# Patient Record
Sex: Male | Born: 2000 | Race: White | Hispanic: No | Marital: Single | Smoking: Former smoker
Health system: Southern US, Community
[De-identification: ages and names within clinical notes are randomized; demographics above are authoritative.]

---

## 2021-04-14 ENCOUNTER — Ambulatory Visit (INDEPENDENT_AMBULATORY_CARE_PROVIDER_SITE_OTHER): Payer: BC Managed Care – PPO | Admitting: Urology

## 2021-04-14 ENCOUNTER — Other Ambulatory Visit: Payer: Self-pay

## 2021-04-14 ENCOUNTER — Encounter: Payer: Self-pay | Admitting: Urology

## 2021-04-14 VITALS — BP 105/52 | HR 65 | Ht 74.0 in | Wt 175.0 lb

## 2021-04-14 DIAGNOSIS — R5383 Other fatigue: Secondary | ICD-10-CM | POA: Diagnosis not present

## 2021-04-14 DIAGNOSIS — E291 Testicular hypofunction: Secondary | ICD-10-CM

## 2021-04-14 NOTE — Progress Notes (Signed)
° °  04/14/21 4:35 PM   Larry Cooke 2000/11/27 [id number]  CC: Low testosterone  HPI: Healthy 21 year old male who presents with low testosterone.  He is a very Radiographer, therapeutic at [employer], and his symptoms initially started about 6 months ago after taking a 1 week course of prednisone for severe poison oak.  At that time he noticed significant decrease in his libido, energy, fatigue, trouble building muscle mass despite extensive working out and eating healthy, as well as trouble with erections for a few months.  His erections have returned to normal over the last few months, but he reports persistent symptoms of low testosterone.  Testosterone was reportedly checked at outside lab and was low at that time at 91.  This increased to 300 a few months later, but on repeat a few weeks ago was low again at 279.  He is sexually active with his girlfriend and denies any problems with erections at this time.  He does not have any children currently.  He also had a borderline elevated hemoglobin A1c on lab work.   Social History:  reports that he quit smoking about 2 months ago. His smoking use included cigarettes. He has never used smokeless tobacco. He reports that he does not currently use alcohol. He reports that he does not use drugs.  Physical Exam: BP (!) 105/52 (BP Location: Left Arm, Patient Position: Sitting, Cuff Size: Normal)    Pulse 65    Ht 6\' 2"  (1.88 m)    Wt 175 lb (79.4 kg)    BMI 22.47 kg/m    Constitutional:  Alert and oriented, No acute distress. Cardiovascular: No clubbing, cyanosis, or edema. Respiratory: Normal respiratory effort, no increased work of breathing. GI: Abdomen is soft, nontender, nondistended, no abdominal masses GU: Phallus with patent meatus, no lesions, testicles 20 cc and descended bilaterally without masses   Laboratory Data: Reviewed, see HPI  Assessment & Plan:   22 year old male with multiple low testosterone levels as well as symptoms of decreased libido,  low energy, fatigue.  He thinks this all started after a 1 week course of prednisone.  He denies any problems with erections at this time.  Most recent testosterone was low at 279.  We reviewed the AUA guidelines regarding evaluation of low testosterone, and I recommended lab visit for morning testosterone, LH, FSH, prolactin, and H/H.  We discussed possible options for low testosterone to maintain fertility including Clomid, versus other less common causes like prolactinoma and possible need for brain MRI or referral to endocrinology.  Lab visit for testosterone, LH, FSH, prolactin, H&H-> call with results  I spent 50 total minutes on the day of the encounter including pre-visit review of the medical record, face-to-face time with the patient, and post visit ordering of labs/imaging/tests.  26, MD 04/14/2021  [facility] [address] [place], [place] [phone]

## 2021-04-15 ENCOUNTER — Telehealth: Payer: Self-pay | Admitting: Urology

## 2021-04-15 DIAGNOSIS — R5383 Other fatigue: Secondary | ICD-10-CM

## 2021-04-15 DIAGNOSIS — E291 Testicular hypofunction: Secondary | ICD-10-CM

## 2021-04-15 NOTE — Telephone Encounter (Signed)
Patient called the office today requesting that we add the following tests to his future lab orders: ?SHBG ?Estradial ? ?He was seen in the [facility] office on 2/28.  He states that they have a family friend that is a urologist in another state that is recommending those 2 tests to be added to the future orders that are in the system. ? ?Please advise.   ?

## 2021-04-15 NOTE — Telephone Encounter (Signed)
Okay to add to blood work panel, thanks ? ?Nickolas Madrid, MD ?04/15/2021  ?

## 2021-04-15 NOTE — Telephone Encounter (Signed)
Lab orders added.

## 2021-04-16 ENCOUNTER — Other Ambulatory Visit
Admission: RE | Admit: 2021-04-16 | Discharge: 2021-04-16 | Disposition: A | Discharge status: Home / Self Care | Payer: BC Managed Care – PPO | Attending: Urology | Admitting: Urology

## 2021-04-16 DIAGNOSIS — R5383 Other fatigue: Secondary | ICD-10-CM | POA: Insufficient documentation

## 2021-04-16 DIAGNOSIS — E291 Testicular hypofunction: Secondary | ICD-10-CM | POA: Diagnosis present

## 2021-04-16 LAB — HEMOGLOBIN AND HEMATOCRIT, BLOOD
HCT: 37.2 % — ABNORMAL LOW (ref 39.0–52.0)
Hemoglobin: 12.5 g/dL — ABNORMAL LOW (ref 13.0–17.0)

## 2021-04-17 LAB — FSH/LH
FSH: 5.6 m[IU]/mL (ref 1.5–12.4)
LH: 1.4 m[IU]/mL — ABNORMAL LOW (ref 1.7–8.6)

## 2021-04-17 LAB — PROLACTIN: Prolactin: 6 ng/mL (ref 4.0–15.2)

## 2021-04-17 LAB — ESTRADIOL: Estradiol: 7.7 pg/mL (ref 7.6–42.6)

## 2021-04-20 ENCOUNTER — Ambulatory Visit: Payer: Self-pay | Admitting: Urology

## 2021-04-21 ENCOUNTER — Ambulatory Visit (INDEPENDENT_AMBULATORY_CARE_PROVIDER_SITE_OTHER): Payer: BC Managed Care – PPO | Admitting: Urology

## 2021-04-21 ENCOUNTER — Other Ambulatory Visit: Payer: Self-pay

## 2021-04-21 DIAGNOSIS — R7989 Other specified abnormal findings of blood chemistry: Secondary | ICD-10-CM | POA: Diagnosis not present

## 2021-04-21 MED ORDER — CLOMIPHENE CITRATE 50 MG PO TABS
25.0000 mg | ORAL_TABLET | Freq: Every day | ORAL | 6 refills | Status: AC
Start: 2021-04-21 — End: ?

## 2021-04-21 NOTE — Progress Notes (Signed)
Virtual Visit via Telephone Note ? ?I connected with [name] on 04/21/21 at  1:45 PM EST by telephone and verified that I am speaking with the correct person using two identifiers. ?  ?Patient location: Home ?Provider location: [facility] ? ? ?I discussed the limitations, risks, security and privacy concerns of performing an evaluation and management service by telephone and the availability of in person appointments. We discussed the impact of the COVID-19 pandemic on the healthcare system, and the importance of social distancing and reducing patient and provider exposure. I also discussed with the patient that there may be a patient responsible charge related to this service. The patient expressed understanding and agreed to proceed. ? ?Reason for visit: ?Low testosterone ? ?History of Present Illness: ?21 year old male who reportedly had significantly decreased energy and problems with erections after a 1 week course of prednisone about 6 months ago for severe poison oak.  His erections have returned to normal since that time but he reports persistent decreased libido, low energy, fatigue, and trouble building muscle mass despite working out and increase caloric intake.  Testosterone values have been low at 91, 300, 279.  He reportedly has had some gynecomastia since puberty. ? ?At our last visit we ordered more extensive blood work including LH mildly low at 1.4, FSH normal at 5.6, prolactin normal at 6.0, H/H mildly low at 12.5/37.2, normal estradiol of 7.7.  Testosterone free, total, SHBG was also sent and is pending. ? ?We reviewed these results, and again discussed the impact of exogenous testosterone on the body's production of testosterone as well as infertility.  I recommended a trial of Clomid 25 mg daily with follow-up in 4 to 6 weeks for repeat testosterone levels.  He is very hesitant to start Clomid secondary to possible side effects he read about on the Internet.  With his  reported history of gynecomastia since puberty, as well as his hesitation to consider Clomid, I recommended referral to endocrinology for further evaluation, and even consideration of head imaging.  He also has a family friend that is a urologist that recommended another medication different from Clomid, but he is unsure what this medication was.  ? ? ?Follow Up: ?-Referral placed to endocrinology ?-Clomid sent to pharmacy for trial if patient desires, if starts Clomid would recommend follow-up testosterone in 6 weeks, and 5-month follow-up with CMP, H&H, testosterone ?  ?I discussed the assessment and treatment plan with the patient. The patient was provided an opportunity to ask questions and all were answered. The patient agreed with the plan and demonstrated an understanding of the instructions. ?  ?The patient was advised to call back or seek an in-person evaluation if the symptoms worsen or if the condition fails to improve as anticipated. ? ?I provided 13 minutes of non-face-to-face time during this encounter. ? ? ?Sondra Come, MD  ? ?

## 2021-04-23 LAB — TESTOSTERONE, FREE, TOTAL, SHBG
Sex Hormone Binding: 35.2 nmol/L (ref 16.5–55.9)
Testosterone, Free: 2 pg/mL — ABNORMAL LOW (ref 9.3–26.5)
Testosterone: 44 ng/dL — ABNORMAL LOW (ref 264–916)

## 2021-04-27 ENCOUNTER — Telehealth: Payer: Self-pay | Admitting: Urology

## 2021-04-27 ENCOUNTER — Other Ambulatory Visit: Payer: Self-pay | Admitting: Urology

## 2021-04-27 DIAGNOSIS — R7989 Other specified abnormal findings of blood chemistry: Secondary | ICD-10-CM

## 2021-04-27 NOTE — Telephone Encounter (Signed)
Called office of Dr. Ronnald Ramp back. LM as she was in a room with a patient.  ?

## 2021-04-27 NOTE — Telephone Encounter (Signed)
Incoming call from Dr. Yetta Barre, lengthy conversation had informing provider of Dr. Keane Scrape plan and the patient's refusal of clomid. Dr. Yetta Barre states that she is calling on behalf of the patient's mother who is very concerned. All questions answered. Will fax office notes per requests to [phone].  ?

## 2021-04-27 NOTE — Telephone Encounter (Signed)
Dr Gaynelle Adu left a message on the voicemail asking if Dr Aleene Davidson or his nurse would please give her a call back in reference to [employer].  Her contact number is [phone]. ?

## 2021-04-28 ENCOUNTER — Other Ambulatory Visit: Payer: Self-pay | Admitting: Family Medicine

## 2021-04-29 ENCOUNTER — Other Ambulatory Visit: Payer: Self-pay | Admitting: Family Medicine

## 2021-04-29 DIAGNOSIS — R454 Irritability and anger: Secondary | ICD-10-CM

## 2021-04-29 DIAGNOSIS — R7989 Other specified abnormal findings of blood chemistry: Secondary | ICD-10-CM

## 2021-04-29 DIAGNOSIS — E349 Endocrine disorder, unspecified: Secondary | ICD-10-CM

## 2021-04-30 NOTE — Telephone Encounter (Signed)
Patient's mom called the office today inquiring about the status of the referral to endocrinology.  I informed her that the referral was sent to [facility] on 04/21/21 and they will contact the patient to schedule the appointment. ? ?I contacted [facility] [phone]) and confirmed that the referral has been received and is being reviewed by the providers.  They confirmed that they will contact the patient to schedule the new patient appointment.   ? ? ?

## 2021-05-04 ENCOUNTER — Other Ambulatory Visit: Payer: Self-pay

## 2021-05-04 ENCOUNTER — Ambulatory Visit
Admission: RE | Admit: 2021-05-04 | Discharge: 2021-05-04 | Disposition: A | Discharge status: Home / Self Care | Payer: BC Managed Care – PPO | Source: Ambulatory Visit | Attending: Family Medicine | Admitting: Family Medicine

## 2021-05-04 DIAGNOSIS — R7989 Other specified abnormal findings of blood chemistry: Secondary | ICD-10-CM

## 2021-05-04 DIAGNOSIS — R454 Irritability and anger: Secondary | ICD-10-CM

## 2021-05-04 DIAGNOSIS — E349 Endocrine disorder, unspecified: Secondary | ICD-10-CM

## 2021-05-04 IMAGING — MR MR HEAD W/O CM
12 series · 48 of 48 positions shown · non-contrast
Comparison: No pertinent prior exams available for comparison.

CLINICAL DATA: Provided history: Hypotestosteronism. Irritability
and anger. Other specified abnormal findings of blood chemistry.

EXAM:
MRI HEAD WITHOUT CONTRAST
TECHNIQUE: Multiplanar, multiecho pulse sequences of the brain and surrounding
structures were obtained without intravenous contrast.

[Series 5: T1 · sagittal · 4.0mm · 0.78mm/px · 1 of 33 slices shown (1 of 2)]
[im 1/33]
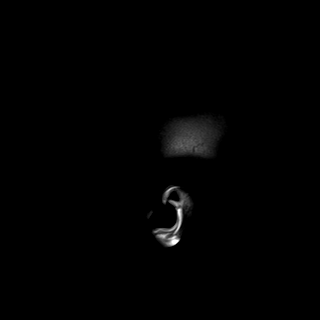

[Series 6: DWI · axial · 3.0mm · 0.98mm/px · z∈[-96,+69]mm · 11 of 187 slices shown (1 of 3)]
[im 1/187]
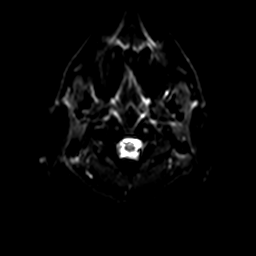
[im 19/187]
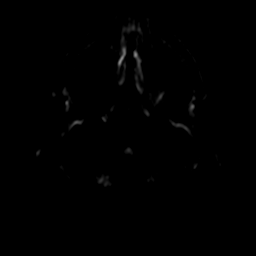
[im 38/187]
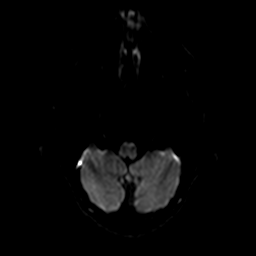
[im 56/187]
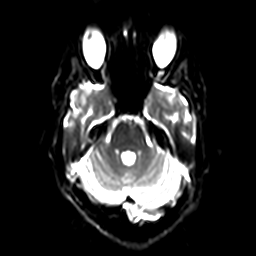
[im 75/187]
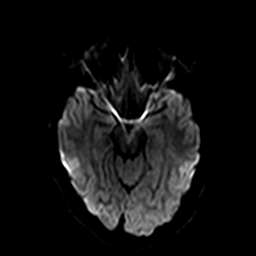
[im 94/187]
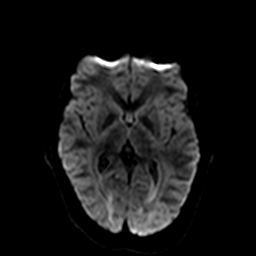
[im 112/187]
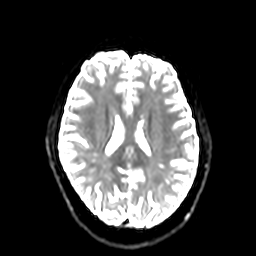
[im 131/187]
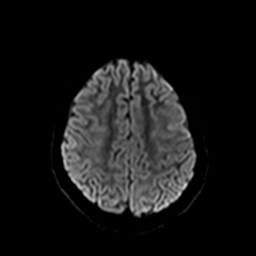
[im 149/187]
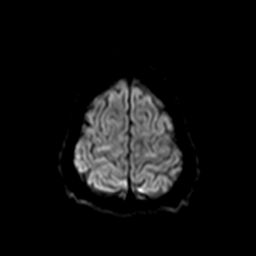
[im 168/187]
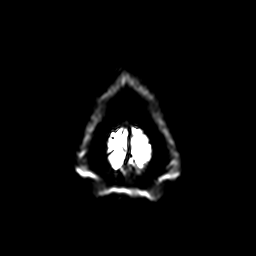
[im 187/187]
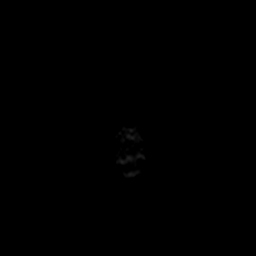

[Series 7: ax dwi_tracew · axial · 3.0mm · 0.98mm/px · z∈[-96,+69]mm · 5 of 94 slices shown]
[im 1/94]
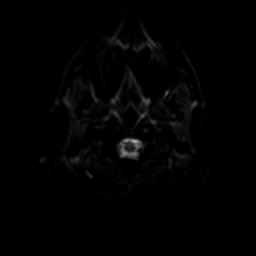
[im 24/94]
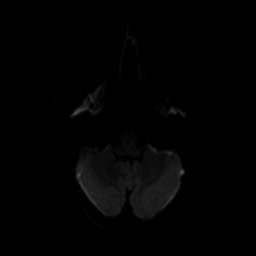
[im 47/94]
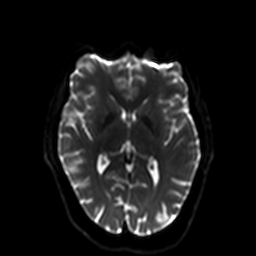
[im 70/94]
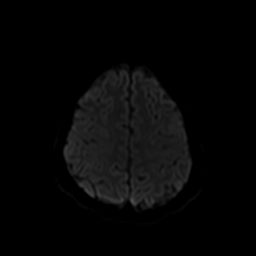
[im 94/94]
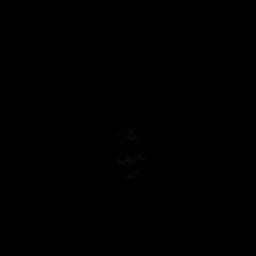

[Series 8: ax dwi_adc · axial · 3.0mm · 0.98mm/px · z∈[-96,+69]mm · 3 of 46 slices shown]
[im 1/46]
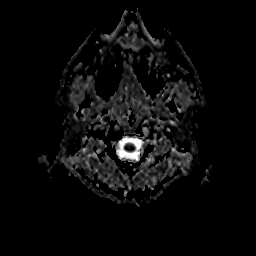
[im 23/46]
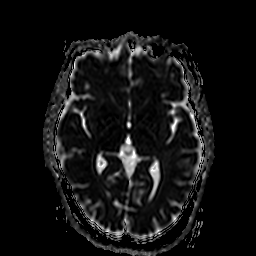
[im 46/46]
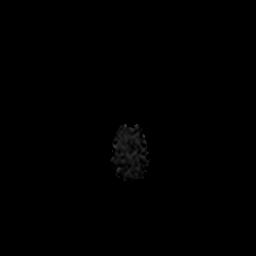

[Series 9: DWI · coronal · 5.0mm · 1.44mm/px · 4 of 74 slices shown (2 of 3)]
[im 1/74]
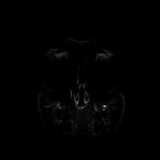
[im 25/74]
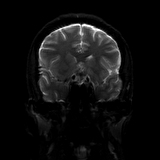
[im 49/74]
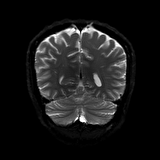
[im 74/74]
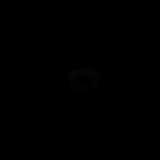

[Series 10: DWI · coronal · 5.0mm · 1.44mm/px · 2 of 34 slices shown (3 of 3)]
[im 1/34]
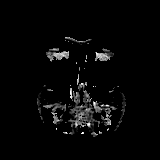
[im 34/34]
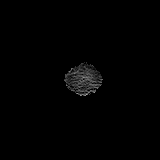

[Series 11: T2 · axial · 4.0mm · 0.39mm/px · z∈[-97,+70]mm · 2 of 33 slices shown (1 of 2)]
[im 1/33]
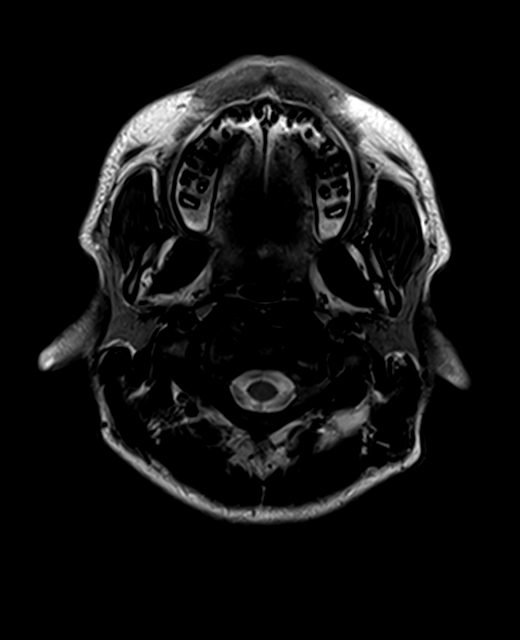
[im 33/33]
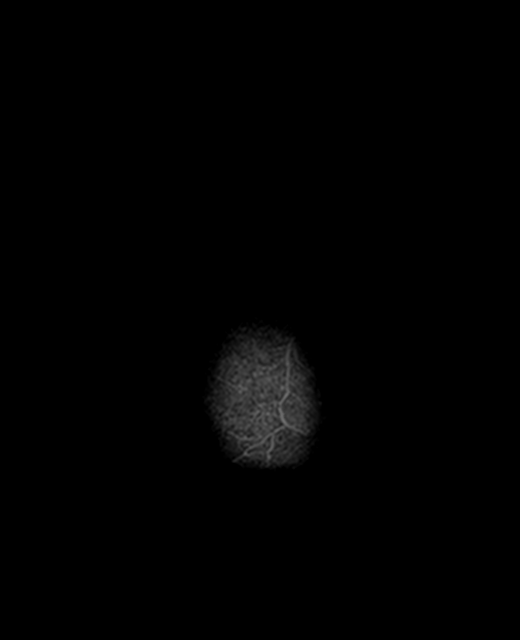

[Series 12: FLAIR · axial · 3.0mm · 0.78mm/px · z∈[-97,+71]mm · 2 of 29 slices shown]
[im 1/29]
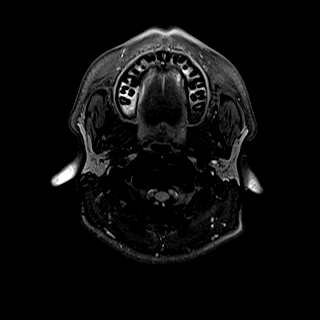
[im 29/29]
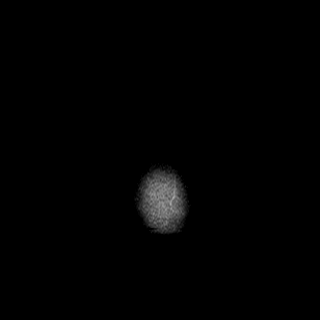

[Series 13: mip_images(sw) · axial · 24.0mm · 0.98mm/px · z∈[-92,+64]mm · 3 of 53 slices shown]
[im 1/53]
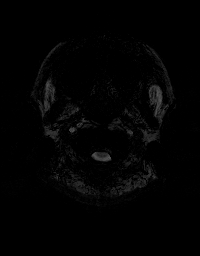
[im 27/53]
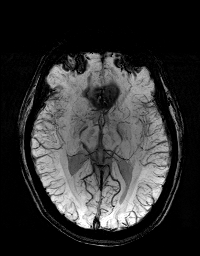
[im 53/53]
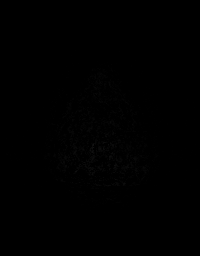

[Series 14: swi_images · axial · 3.0mm · 0.98mm/px · z∈[-102,+75]mm · 3 of 60 slices shown]
[im 1/60]
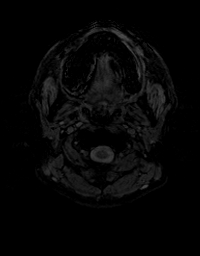
[im 30/60]
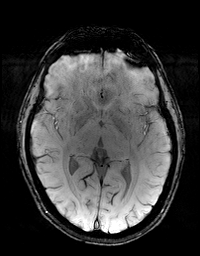
[im 60/60]
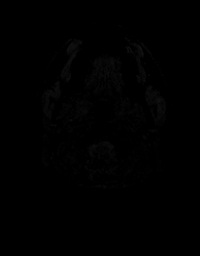

[Series 15: T1 · axial · 1.0mm · 0.98mm/px · z∈[-101,+74]mm · 10 of 176 slices shown (2 of 2)]
[im 1/176]
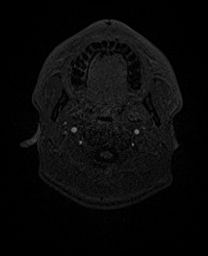
[im 20/176]
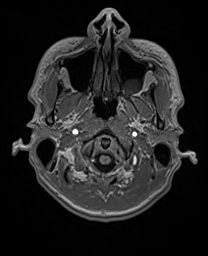
[im 39/176]
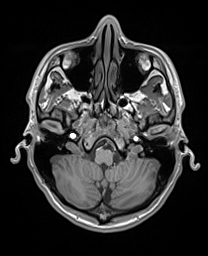
[im 59/176]
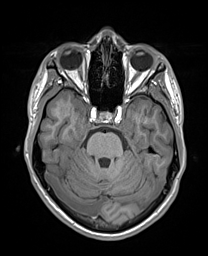
[im 78/176]
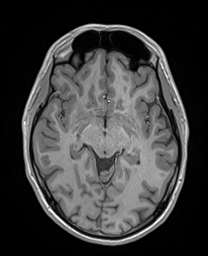
[im 98/176]
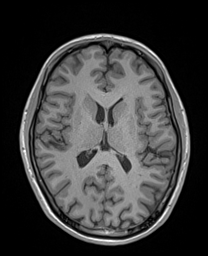
[im 117/176]
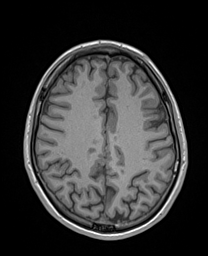
[im 137/176]
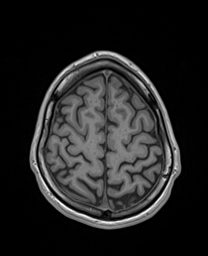
[im 156/176]
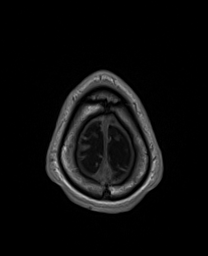
[im 176/176]
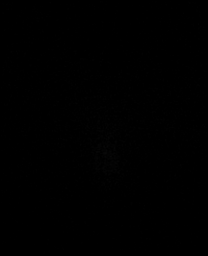

[Series 16: T2 · coronal · 4.5mm · 0.36mm/px · 2 of 35 slices shown (2 of 2)]
[im 1/35]
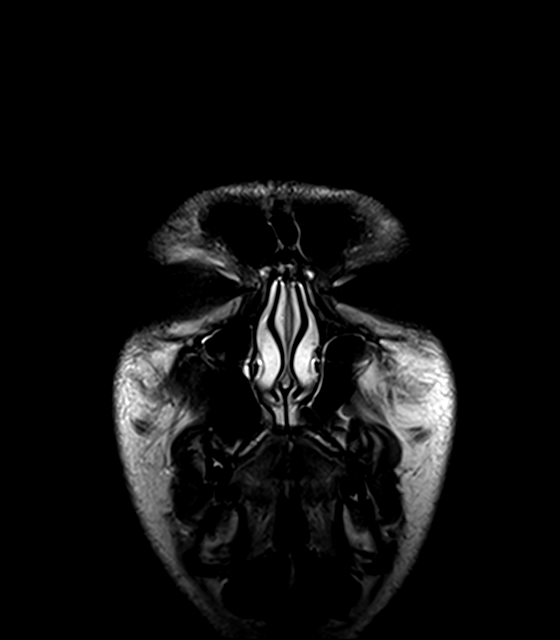
[im 35/35]
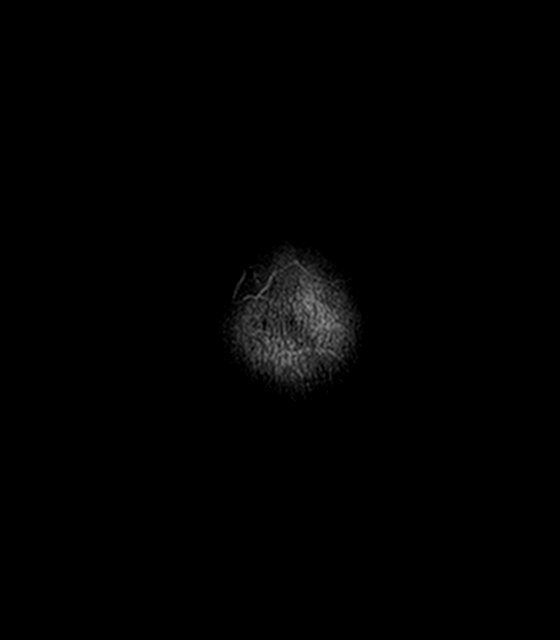

[48 of 48 positions shown; findings below may reference images not displayed]

FINDINGS: Brain:

Cerebral volume is normal.

6 mm pineal cyst.

There is no acute infarct.

No evidence of an intracranial mass.

No chronic intracranial blood products.

No extra-axial fluid collection.

No midline shift.

Vascular: Maintained flow voids within the proximal large arterial
vessels.

Skull and upper cervical spine: No focal suspicious marrow lesion.

Sinuses/Orbits: Visualized orbits show no acute finding. Trace
mucosal thickening within the bilateral frontal and ethmoid sinuses.
Small mucous retention cysts, and background trace mucosal
thickening, within the right maxillary sinus. Mucous retention cysts
measuring up to 16 mm, and background trace mucosal thickening,
within the left maxillary sinus.
IMPRESSION: No evidence of acute intracranial abnormality.

6 mm pineal cyst.

Otherwise unremarkable non-contrast MRI appearance of the brain. If
there is clinical concern for a pituitary microadenoma, consider a
pituitary protocol brain MRI with contrast for further evaluation.

Paranasal sinus disease, as described.

## 2021-05-05 ENCOUNTER — Other Ambulatory Visit: Payer: Self-pay | Admitting: Family Medicine

## 2021-05-05 ENCOUNTER — Other Ambulatory Visit (HOSPITAL_COMMUNITY): Payer: Self-pay | Admitting: Family Medicine

## 2021-05-05 ENCOUNTER — Other Ambulatory Visit: Payer: BC Managed Care – PPO

## 2021-05-05 DIAGNOSIS — R454 Irritability and anger: Secondary | ICD-10-CM

## 2021-05-05 DIAGNOSIS — R9089 Other abnormal findings on diagnostic imaging of central nervous system: Secondary | ICD-10-CM

## 2021-05-05 DIAGNOSIS — F341 Dysthymic disorder: Secondary | ICD-10-CM

## 2021-05-05 DIAGNOSIS — R7989 Other specified abnormal findings of blood chemistry: Secondary | ICD-10-CM

## 2021-05-07 ENCOUNTER — Ambulatory Visit
Admission: RE | Admit: 2021-05-07 | Discharge: 2021-05-07 | Disposition: A | Discharge status: Home / Self Care | Payer: No Typology Code available for payment source | Source: Ambulatory Visit | Attending: Family Medicine | Admitting: Family Medicine

## 2021-05-07 DIAGNOSIS — F341 Dysthymic disorder: Secondary | ICD-10-CM

## 2021-05-07 DIAGNOSIS — R7989 Other specified abnormal findings of blood chemistry: Secondary | ICD-10-CM

## 2021-05-07 DIAGNOSIS — R454 Irritability and anger: Secondary | ICD-10-CM

## 2021-05-07 IMAGING — MR MR HEAD WO/W CM
13 series · 48 of 48 positions shown · IV contrast (8 ML MULTIHANCE)
Comparison: [DATE]

CLINICAL DATA: Low testosterone levels.

.  Irritability and anger.
EXAM:
MRI HEAD WITHOUT AND WITH CONTRAST
TECHNIQUE: Multiplanar, multiecho pulse sequences of the brain and surrounding
structures were obtained without and with intravenous contrast.
CONTRAST:  8mL MULTIHANCE GADOBENATE DIMEGLUMINE 529 MG/ML IV SOLN

[Series 7: T1 · sagittal · 3.0mm · 0.42mm/px · 1 of 13 slices shown (1 of 3)]
[im 1/13]
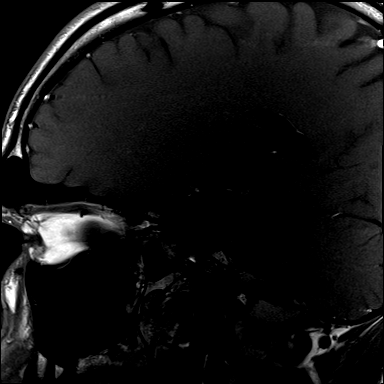

[Series 8: T2 · coronal · 3.0mm · 0.42mm/px · 1 of 10 slices shown]
[im 1/10]
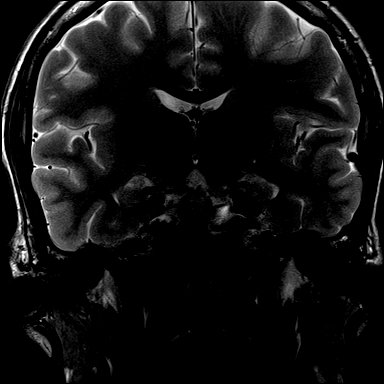

[Series 9: T1 · coronal · 3.0mm · 0.42mm/px · 1 of 10 slices shown (2 of 3)]
[im 1/10]
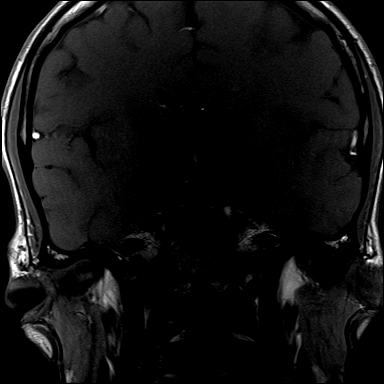

[Series 10: T1 · coronal · non-contrast · 3.0mm · 0.62mm/px · 1 of 9 slices shown (3 of 3)]
[im 1/9]
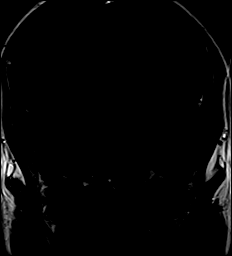

[Series 11: cor post dyn · coronal · 3.0mm · 0.62mm/px · 2 of 9 slices shown (1 of 6)]
[im 1/9]
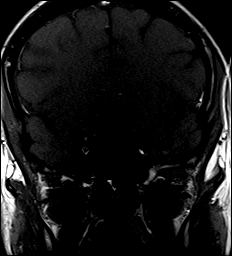
[im 9/9]
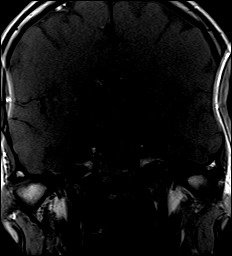

[Series 12: cor post dyn · coronal · 3.0mm · 0.62mm/px · 2 of 9 slices shown (2 of 6)]
[im 1/9]
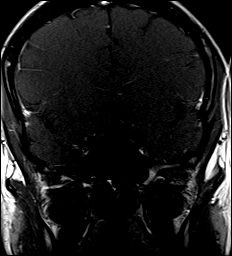
[im 9/9]
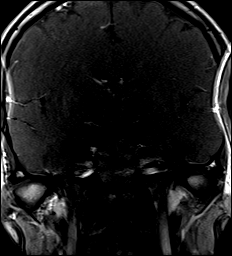

[Series 13: cor post dyn · coronal · 3.0mm · 0.62mm/px · 2 of 9 slices shown (3 of 6)]
[im 1/9]
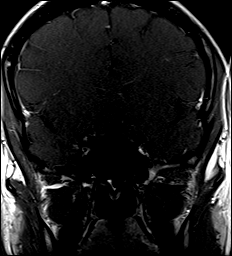
[im 9/9]
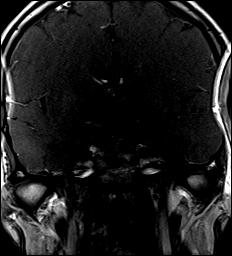

[Series 14: cor post dyn · coronal · 3.0mm · 0.62mm/px · 2 of 9 slices shown (4 of 6)]
[im 1/9]
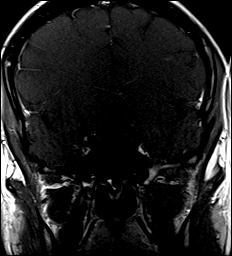
[im 9/9]
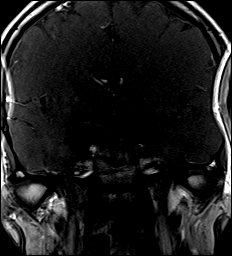

[Series 15: cor post dyn · coronal · 3.0mm · 0.62mm/px · 2 of 9 slices shown (5 of 6)]
[im 1/9]
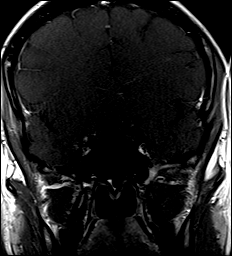
[im 9/9]
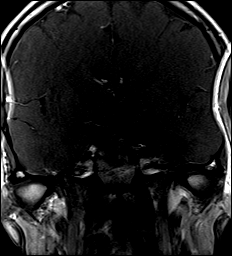

[Series 16: cor post dyn · coronal · 3.0mm · 0.62mm/px · 2 of 9 slices shown (6 of 6)]
[im 1/9]
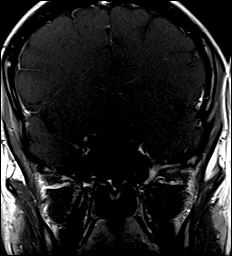
[im 9/9]
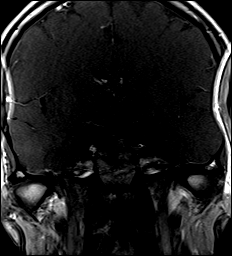

[Series 17: T1 post-contrast · sagittal · 3.0mm · 0.42mm/px · 2 of 13 slices shown (1 of 3)]
[im 1/13]
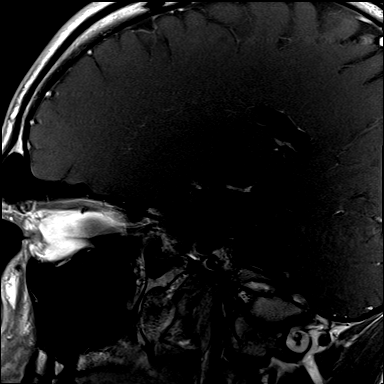
[im 13/13]
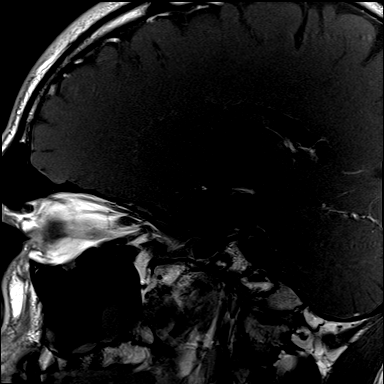

[Series 18: T1 post-contrast · coronal · 3.0mm · 0.42mm/px · 2 of 10 slices shown (2 of 3)]
[im 1/10]
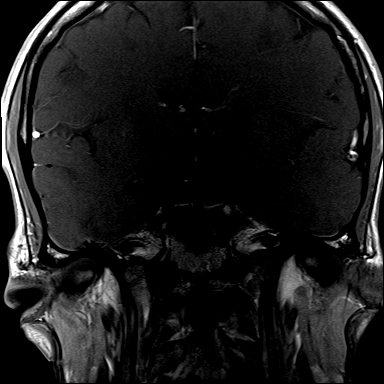
[im 10/10]
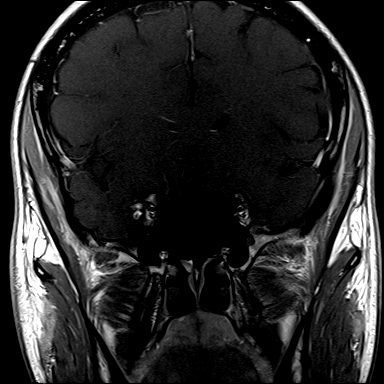

[Series 19: T1 post-contrast · axial · 1.0mm · 0.90mm/px · z∈[-89,+69]mm · 28 of 160 slices shown (3 of 3)]
[im 1/160]
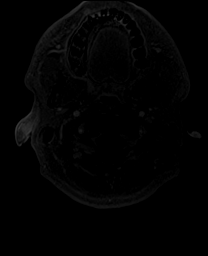
[im 6/160]
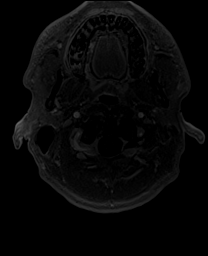
[im 12/160]
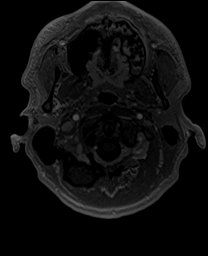
[im 18/160]
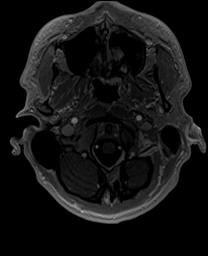
[im 24/160]
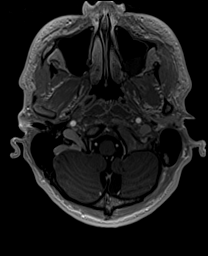
[im 30/160]
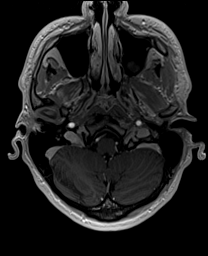
[im 36/160]
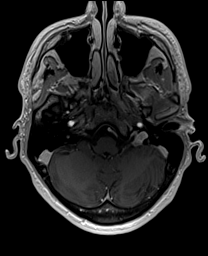
[im 42/160]
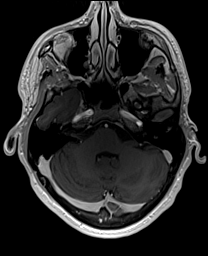
[im 48/160]
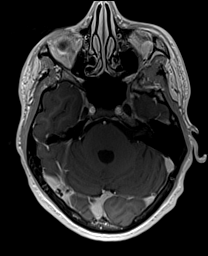
[im 54/160]
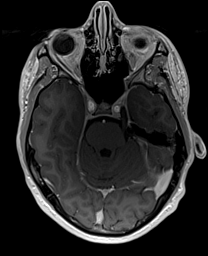
[im 59/160]
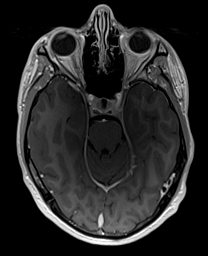
[im 65/160]
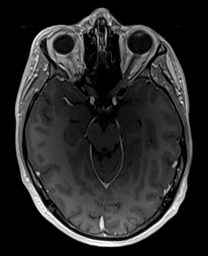
[im 71/160]
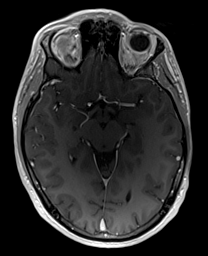
[im 77/160]
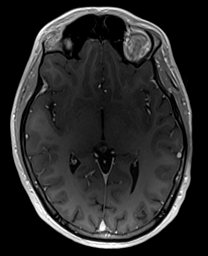
[im 83/160]
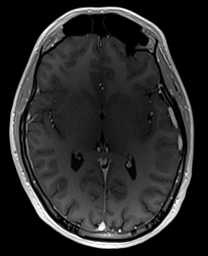
[im 89/160]
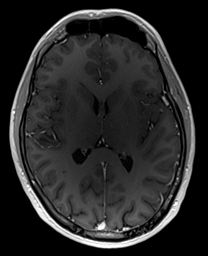
[im 95/160]
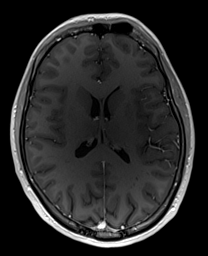
[im 101/160]
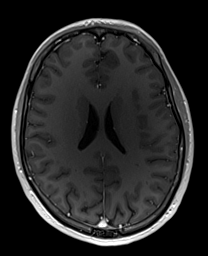
[im 107/160]
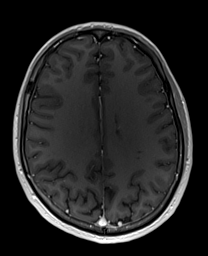
[im 112/160]
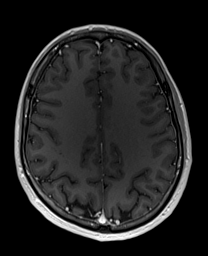
[im 118/160]
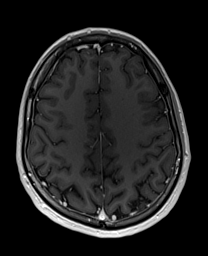
[im 124/160]
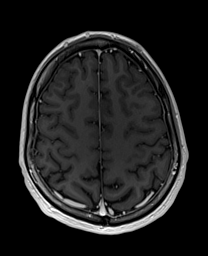
[im 130/160]
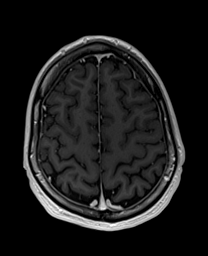
[im 136/160]
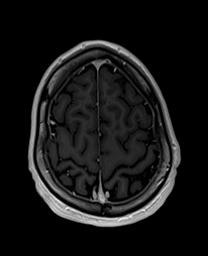
[im 142/160]
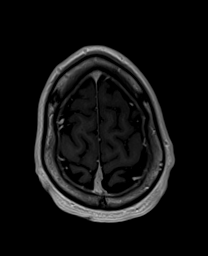
[im 148/160]
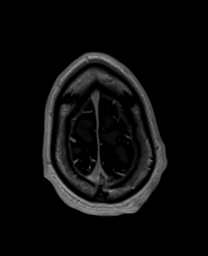
[im 154/160]
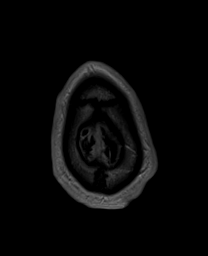
[im 160/160]
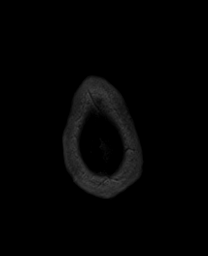

[48 of 48 positions shown; findings below may reference images not displayed]

FINDINGS: Pituitary/Sella: The pituitary gland is normal in appearance without
mass lesion. A normal posterior pituitary bright spot is seen. The
infundibulum is midline. The hypothalamus and mamillary bodies are
normal. There is no mass effect on the optic chiasm or optic nerves.
The infundibular and chiasmatic recesses are clear. Normal cavernous
sinus and cavernous internal carotid artery flow voids.
IMPRESSION: Normal MRI of the pituitary gland.

## 2021-05-07 MED ORDER — GADOBENATE DIMEGLUMINE 529 MG/ML IV SOLN
8.0000 mL | Freq: Once | INTRAVENOUS | Status: AC | PRN
  Administered 2021-05-07: 8 mL via INTRAVENOUS

## 2023-03-08 ENCOUNTER — Ambulatory Visit (INDEPENDENT_AMBULATORY_CARE_PROVIDER_SITE_OTHER): Payer: Self-pay | Admitting: Oncology

## 2023-03-08 ENCOUNTER — Encounter: Payer: Self-pay | Admitting: Oncology

## 2023-03-08 VITALS — HR 94 | Temp 98.2°F | Ht 74.0 in | Wt 213.0 lb

## 2023-03-08 DIAGNOSIS — R051 Acute cough: Secondary | ICD-10-CM

## 2023-03-08 NOTE — Progress Notes (Signed)
[facility] 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 Does not cough much but when he does it hurts.  Sx started 3 days ago. Mild SeaTac but nothing else. Breathing feels labored at times. Feels like when he is laying on his side he hears weird sounds. Coughed up some bloody mucous last night.   Takes vitamin C routinely. Nothing else OTC.   Current Medication:  Outpatient Encounter Medications as of 03/08/2023  Medication Sig   testosterone cypionate (DEPOTESTOSTERONE CYPIONATE) 200 MG/ML injection Inject 200 mg into the muscle.   clomiPHENE (CLOMID) 50 MG tablet Take 0.5 tablets (25 mg total) by mouth daily. (Patient not taking: Reported on 03/08/2023)   No facility-administered encounter medications on file as of 03/08/2023.   Medical History: History reviewed. No pertinent past medical history.  Vital Signs: Pulse 94   Temp 98.2 F (36.8 C) (Tympanic)   Ht 6\' 2"  (1.88 m)   Wt 213 lb (96.6 kg)   SpO2 96%   BMI 27.35 kg/m   ROS: As per HPI.  All other pertinent ROS negative.     Review of Systems  Constitutional:  Negative for chills, diaphoresis, fatigue and fever.  HENT:  Positive for congestion. Negative for postnasal drip, sinus pressure, sinus pain and sore throat.   Respiratory:  Positive for cough and chest tightness.   Hematological:  Negative for adenopathy.    Physical Exam Vitals reviewed.  Constitutional:      General: He is not in acute distress.    Appearance: Normal appearance.  HENT:     Right Ear: A middle ear effusion is present.     Left Ear: A middle ear effusion is present.     Nose: No mucosal  edema, congestion or rhinorrhea.     Right Turbinates: Not swollen.     Left Turbinates: Not swollen.     Right Sinus: No maxillary sinus tenderness or frontal sinus tenderness.     Left Sinus: No maxillary sinus tenderness or frontal sinus tenderness.  Cardiovascular:     Rate and Rhythm: Normal rate and regular rhythm.  Pulmonary:     Effort: Pulmonary effort is normal. Prolonged expiration present.     Breath sounds: Decreased breath sounds present. No wheezing.  Lymphadenopathy:     Head:     Right side of head: No submandibular, tonsillar or preauricular adenopathy.     Left side of head: No submandibular, tonsillar or preauricular adenopathy.     Cervical: No cervical adenopathy.  Neurological:     Mental Status: He is alert.    No results found for this or any previous visit (from the past 24 hours).  Assessment/Plan: 1. Acute cough (Primary) Exam benign but given symptoms would recommend trying Flonase and ibuprofen for the pain. He can't tolerate prednisone so will stear clear of steroids. Recommend mucinex to help thin secretions and get sputum out of chest. If not improvement by Thursday/Friday would recommend chest xray.   General Counseling: [name] verbalizes understanding of the findings of todays visit and agrees with plan of treatment. I have discussed any further diagnostic evaluation that  may be needed or ordered today. We also reviewed his medications today. he has been encouraged to call the office with any questions or concerns that should arise related to todays visit.   No orders of the defined types were placed in this encounter.   No orders of the defined types were placed in this encounter.   I spent 20 minutes dedicated to the care of this patient (face-to-face and non-face-to-face) on the date of the encounter to include what is described in the assessment and plan.   Durenda Hurt, NP 03/08/2023 12:47 PM
